# Patient Record
Sex: Female | Born: 1980 | Race: White | Hispanic: No | Marital: Single | Smoking: Current every day smoker
Health system: Southern US, Community
[De-identification: ages and names within clinical notes are randomized; demographics above are authoritative.]

## PROBLEM LIST (undated history)

## (undated) ENCOUNTER — Inpatient Hospital Stay (HOSPITAL_COMMUNITY): Payer: Self-pay

## (undated) DIAGNOSIS — Z789 Other specified health status: Secondary | ICD-10-CM

---
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
## 2003-05-27 ENCOUNTER — Emergency Department (HOSPITAL_COMMUNITY): Admission: EM | Admit: 2003-05-27 | Discharge: 2003-05-27 | Payer: Self-pay | Admitting: Emergency Medicine

## 2003-06-08 ENCOUNTER — Emergency Department (HOSPITAL_COMMUNITY): Admission: EM | Admit: 2003-06-08 | Discharge: 2003-06-08 | Payer: Self-pay | Admitting: Emergency Medicine

## 2004-02-08 ENCOUNTER — Emergency Department (HOSPITAL_COMMUNITY): Admission: EM | Admit: 2004-02-08 | Discharge: 2004-02-08 | Payer: Self-pay | Admitting: Emergency Medicine

## 2005-01-22 ENCOUNTER — Emergency Department (HOSPITAL_COMMUNITY): Admission: EM | Admit: 2005-01-22 | Discharge: 2005-01-22 | Payer: Self-pay | Admitting: Emergency Medicine

## 2005-02-01 ENCOUNTER — Emergency Department (HOSPITAL_COMMUNITY): Admission: EM | Admit: 2005-02-01 | Discharge: 2005-02-01 | Payer: Self-pay | Admitting: Emergency Medicine

## 2005-03-24 ENCOUNTER — Emergency Department (HOSPITAL_COMMUNITY): Admission: EM | Admit: 2005-03-24 | Discharge: 2005-03-24 | Payer: Self-pay | Admitting: Emergency Medicine

## 2005-09-01 ENCOUNTER — Emergency Department (HOSPITAL_COMMUNITY): Admission: EM | Admit: 2005-09-01 | Discharge: 2005-09-02 | Payer: Self-pay | Admitting: Emergency Medicine

## 2005-09-25 ENCOUNTER — Inpatient Hospital Stay (HOSPITAL_COMMUNITY): Admission: AD | Admit: 2005-09-25 | Discharge: 2005-09-25 | Payer: Self-pay | Admitting: Obstetrics and Gynecology

## 2006-01-07 ENCOUNTER — Ambulatory Visit: Payer: Self-pay | Admitting: Obstetrics and Gynecology

## 2006-01-07 ENCOUNTER — Inpatient Hospital Stay (HOSPITAL_COMMUNITY): Admission: AD | Admit: 2006-01-07 | Discharge: 2006-01-07 | Payer: Self-pay | Admitting: Gynecology

## 2006-01-09 ENCOUNTER — Ambulatory Visit: Payer: Self-pay | Admitting: Obstetrics & Gynecology

## 2006-03-05 ENCOUNTER — Inpatient Hospital Stay (HOSPITAL_COMMUNITY): Admission: AD | Admit: 2006-03-05 | Discharge: 2006-03-05 | Payer: Self-pay | Admitting: Family Medicine

## 2006-03-05 ENCOUNTER — Ambulatory Visit: Payer: Self-pay | Admitting: Obstetrics and Gynecology

## 2006-03-13 ENCOUNTER — Ambulatory Visit: Payer: Self-pay | Admitting: Obstetrics & Gynecology

## 2006-03-18 ENCOUNTER — Ambulatory Visit (HOSPITAL_COMMUNITY): Admission: RE | Admit: 2006-03-18 | Discharge: 2006-03-18 | Payer: Self-pay | Admitting: Family Medicine

## 2006-03-20 ENCOUNTER — Ambulatory Visit: Payer: Self-pay | Admitting: Obstetrics & Gynecology

## 2006-03-27 ENCOUNTER — Ambulatory Visit: Payer: Self-pay | Admitting: *Deleted

## 2006-04-05 ENCOUNTER — Ambulatory Visit: Payer: Self-pay | Admitting: Obstetrics & Gynecology

## 2006-04-08 ENCOUNTER — Ambulatory Visit: Payer: Self-pay | Admitting: *Deleted

## 2006-04-08 ENCOUNTER — Inpatient Hospital Stay (HOSPITAL_COMMUNITY): Admission: AD | Admit: 2006-04-08 | Discharge: 2006-04-10 | Payer: Self-pay | Admitting: Obstetrics & Gynecology

## 2006-05-21 HISTORY — PX: CHOLECYSTECTOMY: SHX55

## 2006-05-25 ENCOUNTER — Emergency Department (HOSPITAL_COMMUNITY): Admission: EM | Admit: 2006-05-25 | Discharge: 2006-05-25 | Payer: Self-pay | Admitting: Emergency Medicine

## 2007-05-13 IMAGING — US US OB COMP +14 WK
1 series · 13 of 28 positions shown · non-contrast
Comparison: none

CLINICAL DATA: 27 week 1 day assigned gestational age by prior ultrasound.  Evaluate fetal anatomy and growth.  Cramping.

[Series 1: us ob comp +14 wk · 0.29mm/px · 13 of 66 slices shown]
[im 3/66]
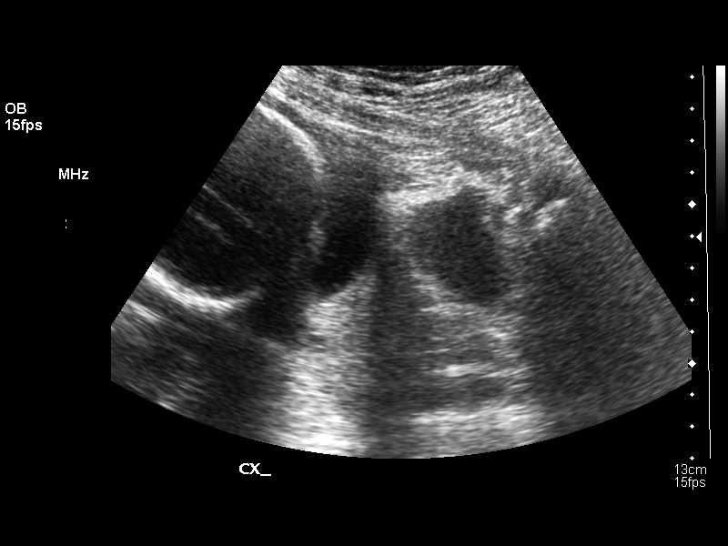
[im 8/66]
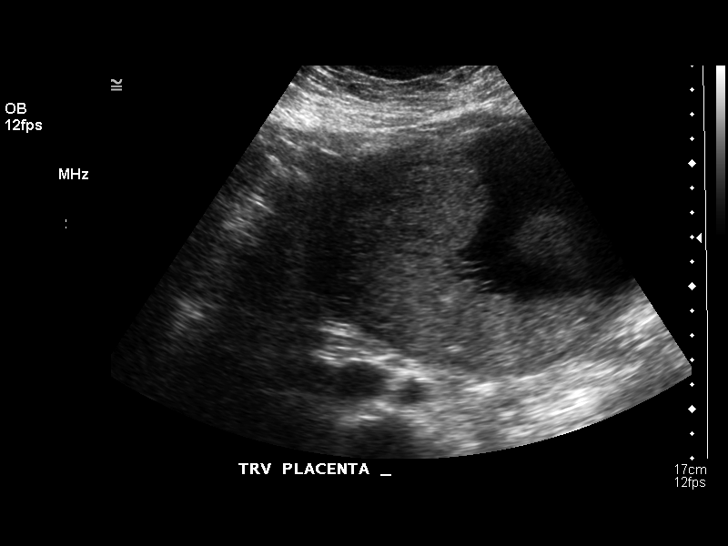
[im 13/66]
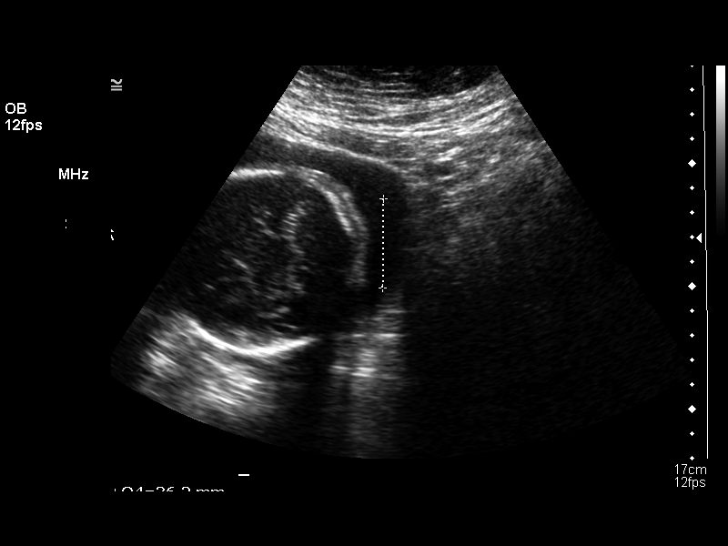
[im 17/66]
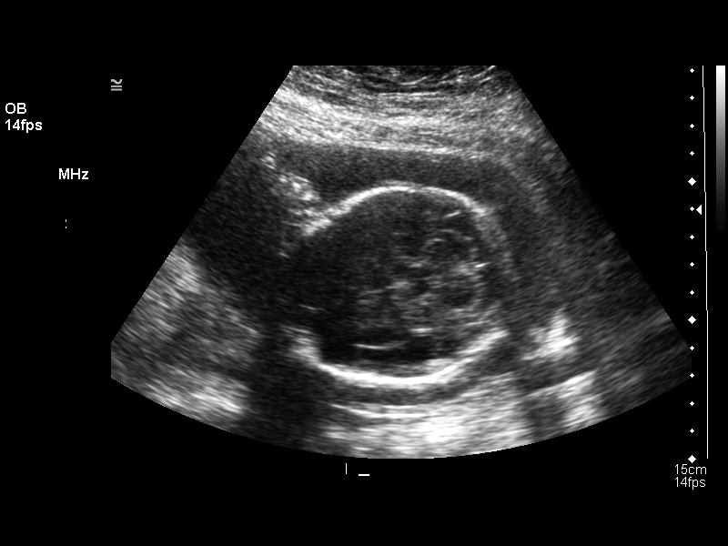
[im 22/66]
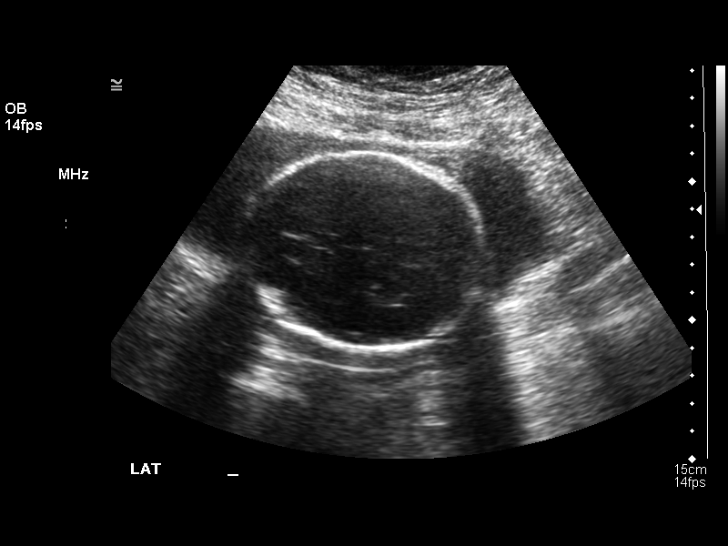
[im 27/66]
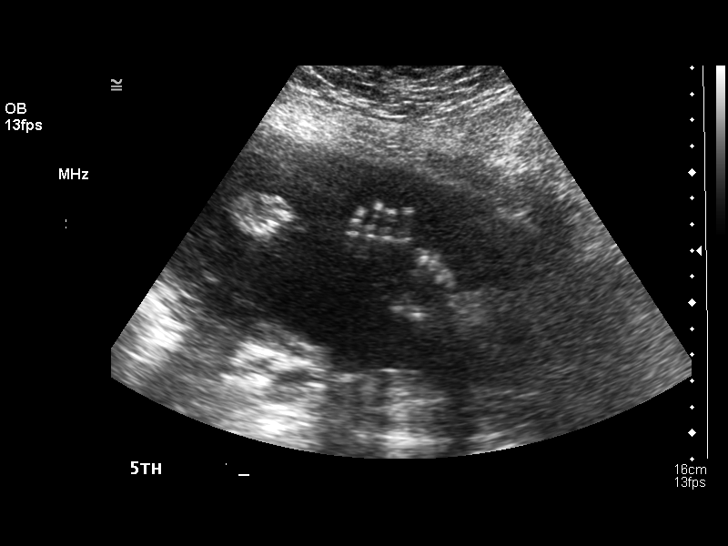
[im 34/66]
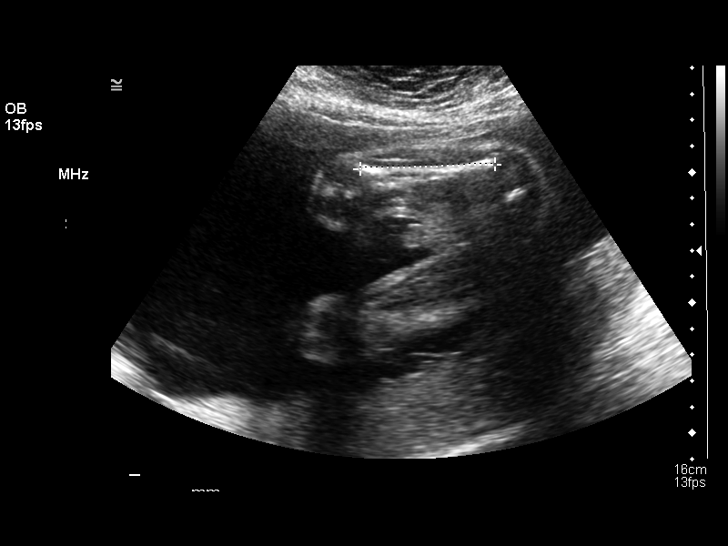
[im 39/66]
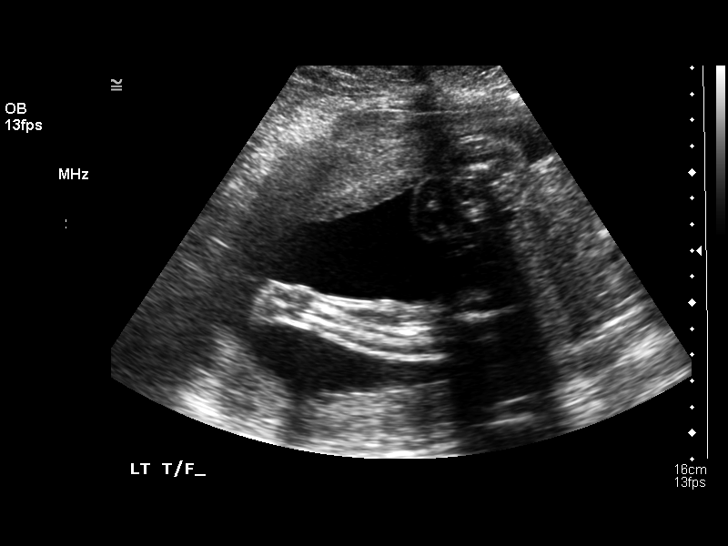
[im 44/66]
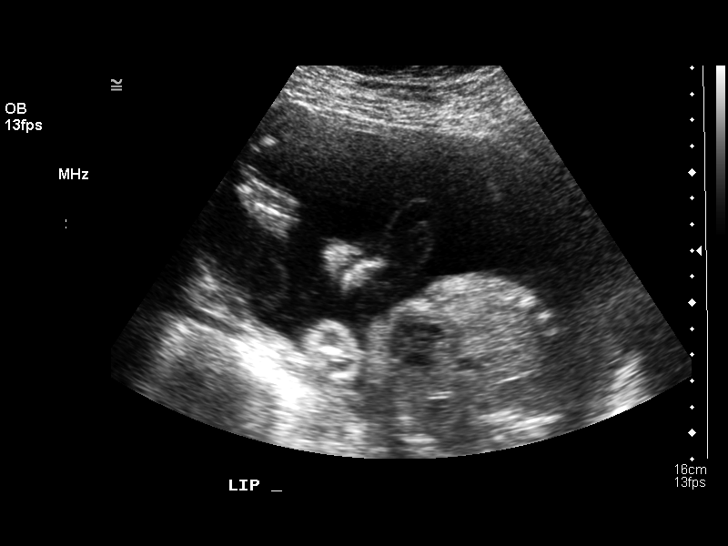
[im 49/66]
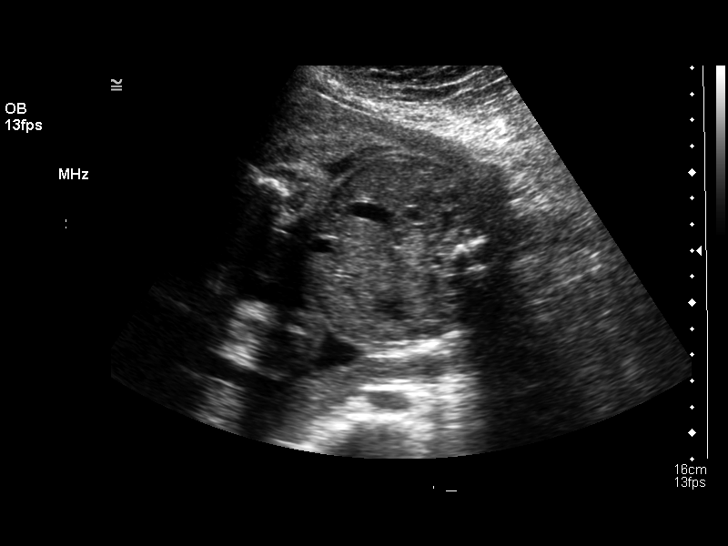
[im 53/66]
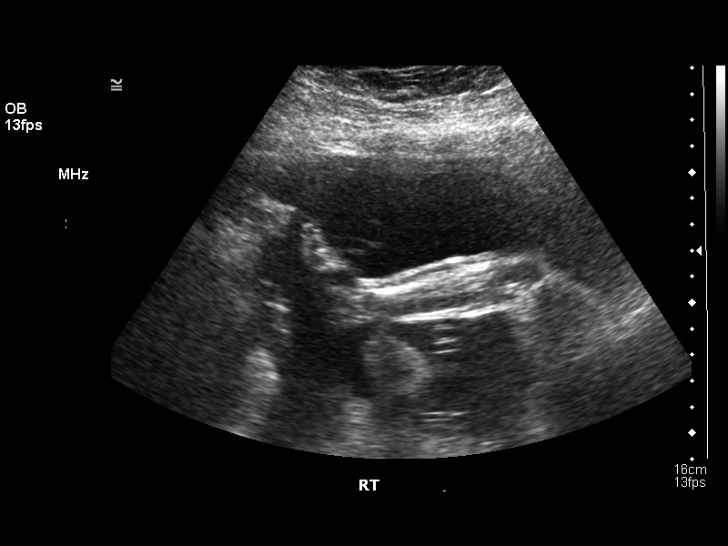
[im 58/66]
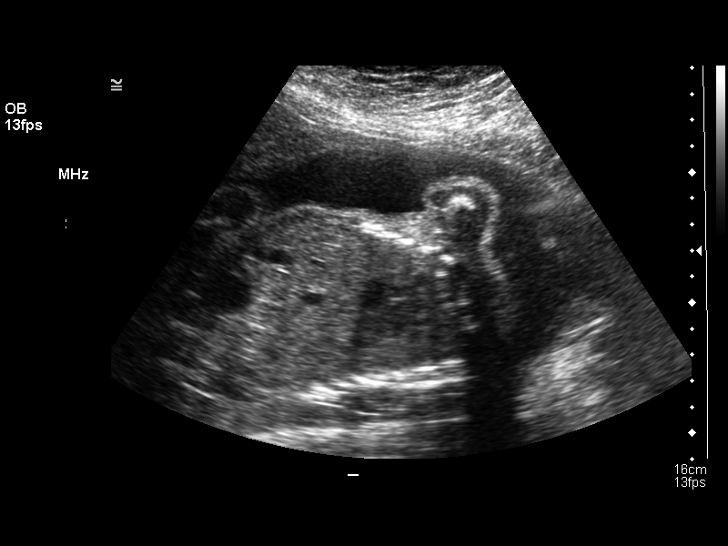
[im 63/66]
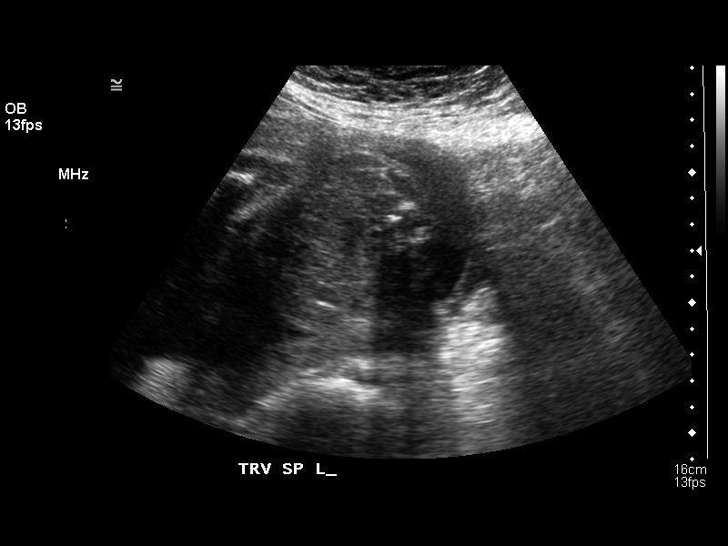

[13 of 28 positions shown; findings below may reference images not displayed]

OBSTETRICAL ULTRASOUND:
Number of Fetuses:  1
Heart Rate:  139 bpm
Movement:  Yes
Breathing:  No      
Presentation:  Cephalic
Placental Location:  Posterior
Grade:  I
Previa:   No
Amniotic Fluid (Subjective):  Normal
Amniotic Fluid (Objective):   AFI 14.5 cm (7th-17th %ile = 9.5 to 22.6 cm for 27 weeks) 

FETAL BIOMETRY
BPD:  7.0 cm  28 w 0 d
HC:  25.8 cm  28 w 0 d
AC:  24.7 cm  28 w 6 d
FL:  5.2 cm  21 w 5 d

MEAN GA:   28 w 1 d    US EDC:  03/31/06
Assigned GA:  27 w 1 d    Assigned EDC:    04/07/06

EFW: 9551   grams 90th - 95th %ile (2260 - 2169 g) for 27 weeks 

FETAL ANATOMY
Lateral Ventricles:  Visualized 
Thalami/CSP:  Visualized   
Posterior Fossa:  Visualized   
Nuchal Region:  Visualized 
Spine:  Not visualized   
4 Chamber Heart on Left:  Not visualized   
Stomach on Left:  Visualized   
3 Vessel Cord:  Visualized 
Cord Insertion site:  Visualized 
Kidneys:    Visualized   
Bladder:  Visualized   
Extremities:  Visualized   

ADDITIONAL ANATOMY VISUALIZED:  Upper lip, orbits, diaphragm, heel, and fifth digit.

Evaluation limited by:  Maternal habitus, fetal position, and advanced gestational age.

MATERNAL UTERINE AND ADNEXAL FINDINGS
Cervix:   3.5 cm transabdominally.
IMPRESSION: 1.  Assigned gestational age by prior ultrasound is currently 27 weeks 1 day.  Fetus is currently large for gestational age, with EFW at 90-95th percentile.  Further ultrasound follow-up of fetal growth is recommended to exclude macrosomia.
2.  Limited anatomic evaluation, as above.  isualized fetal anatomy is unremarkable.
3.  Normal amniotic fluid volume.  Normal cervical length.

## 2010-06-11 ENCOUNTER — Encounter: Payer: Self-pay | Admitting: *Deleted

## 2018-02-17 ENCOUNTER — Inpatient Hospital Stay (HOSPITAL_COMMUNITY): Payer: Medicaid Other

## 2018-02-17 ENCOUNTER — Inpatient Hospital Stay (HOSPITAL_COMMUNITY)
Admission: AD | Admit: 2018-02-17 | Discharge: 2018-02-17 | Disposition: A | Discharge status: Home / Self Care | Payer: Medicaid Other | Source: Ambulatory Visit | Attending: Obstetrics and Gynecology | Admitting: Obstetrics and Gynecology

## 2018-02-17 ENCOUNTER — Encounter (HOSPITAL_COMMUNITY): Payer: Self-pay

## 2018-02-17 DIAGNOSIS — F17210 Nicotine dependence, cigarettes, uncomplicated: Secondary | ICD-10-CM | POA: Insufficient documentation

## 2018-02-17 DIAGNOSIS — O468X1 Other antepartum hemorrhage, first trimester: Secondary | ICD-10-CM

## 2018-02-17 DIAGNOSIS — Z3A01 Less than 8 weeks gestation of pregnancy: Secondary | ICD-10-CM | POA: Insufficient documentation

## 2018-02-17 DIAGNOSIS — O208 Other hemorrhage in early pregnancy: Secondary | ICD-10-CM | POA: Diagnosis not present

## 2018-02-17 DIAGNOSIS — O23591 Infection of other part of genital tract in pregnancy, first trimester: Secondary | ICD-10-CM | POA: Insufficient documentation

## 2018-02-17 DIAGNOSIS — Z3490 Encounter for supervision of normal pregnancy, unspecified, unspecified trimester: Secondary | ICD-10-CM

## 2018-02-17 DIAGNOSIS — O99331 Smoking (tobacco) complicating pregnancy, first trimester: Secondary | ICD-10-CM | POA: Diagnosis not present

## 2018-02-17 DIAGNOSIS — N760 Acute vaginitis: Secondary | ICD-10-CM | POA: Diagnosis present

## 2018-02-17 DIAGNOSIS — R109 Unspecified abdominal pain: Secondary | ICD-10-CM | POA: Diagnosis present

## 2018-02-17 DIAGNOSIS — O418X10 Other specified disorders of amniotic fluid and membranes, first trimester, not applicable or unspecified: Secondary | ICD-10-CM | POA: Diagnosis present

## 2018-02-17 DIAGNOSIS — O209 Hemorrhage in early pregnancy, unspecified: Secondary | ICD-10-CM | POA: Diagnosis present

## 2018-02-17 DIAGNOSIS — B9689 Other specified bacterial agents as the cause of diseases classified elsewhere: Secondary | ICD-10-CM | POA: Insufficient documentation

## 2018-02-17 HISTORY — DX: Other specified health status: Z78.9

## 2018-02-17 LAB — CBC
HCT: 35.8 % — ABNORMAL LOW (ref 36.0–46.0)
Hemoglobin: 12.3 g/dL (ref 12.0–15.0)
MCH: 30.2 pg (ref 26.0–34.0)
MCHC: 34.4 g/dL (ref 30.0–36.0)
MCV: 88 fL (ref 78.0–100.0)
PLATELETS: 202 10*3/uL (ref 150–400)
RBC: 4.07 MIL/uL (ref 3.87–5.11)
RDW: 13.6 % (ref 11.5–15.5)
WBC: 10.8 10*3/uL — ABNORMAL HIGH (ref 4.0–10.5)

## 2018-02-17 LAB — WET PREP, GENITAL
SPERM: NONE SEEN
Trich, Wet Prep: NONE SEEN
YEAST WET PREP: NONE SEEN

## 2018-02-17 LAB — URINALYSIS, ROUTINE W REFLEX MICROSCOPIC
BILIRUBIN URINE: NEGATIVE
Bacteria, UA: NONE SEEN
Glucose, UA: NEGATIVE mg/dL
Ketones, ur: NEGATIVE mg/dL
LEUKOCYTES UA: NEGATIVE
NITRITE: NEGATIVE
Protein, ur: NEGATIVE mg/dL
Specific Gravity, Urine: 1.012 (ref 1.005–1.030)
pH: 6 (ref 5.0–8.0)

## 2018-02-17 LAB — POCT PREGNANCY, URINE: PREG TEST UR: POSITIVE — AB

## 2018-02-17 LAB — HCG, QUANTITATIVE, PREGNANCY: HCG, BETA CHAIN, QUANT, S: 177053 m[IU]/mL — AB (ref <5)

## 2018-02-17 MED ORDER — METRONIDAZOLE 500 MG PO TABS: 500.0000 mg | ORAL_TABLET | Freq: Two times a day (BID) | ORAL | 0 refills | Status: AC

## 2018-02-17 MED ORDER — COMPLETENATE 29-1 MG PO CHEW: 1.0000 | CHEWABLE_TABLET | Freq: Every day | ORAL | 1 refills | Status: AC

## 2018-02-17 MED ORDER — PROMETHAZINE HCL 12.5 MG PO TABS: 12.5000 mg | ORAL_TABLET | Freq: Four times a day (QID) | ORAL | 0 refills | Status: DC | PRN

## 2018-02-17 NOTE — MAU Provider Note (Addendum)
History     CSN: [id number]  Arrival date and time: 02/17/18 8657   First Provider Initiated Contact with Patient 02/17/18 2104      Chief Complaint  Patient presents with  . Vaginal Bleeding  . Abdominal Pain   HPI  Ms.  Victoria Briggs is a 37 y.o. year old [id number] female at [redacted]w[redacted]d weeks gestation who presents to MAU reporting (+) UPT on 01/31/2018, abdominal pain, and VB with stringy clots today @ 1830. She has not taken any medications for pain today.  Past Medical History:  Diagnosis Date  . Medical history non-contributory     Past Surgical History:  Procedure Laterality Date  . CHOLECYSTECTOMY  2008    History reviewed. No pertinent family history.  Social History   Tobacco Use  . Smoking status: Current Every Day Smoker    Packs/day: 0.50    Types: Cigarettes  . Smokeless tobacco: Never Used  Substance Use Topics  . Alcohol use: Not Currently    Frequency: Never  . Drug use: Never    Allergies:  Allergies  Allergen Reactions  . Latex Hives    Medications Prior to Admission  Medication Sig Dispense Refill Last Dose  . acetaminophen (TYLENOL) 325 MG tablet Take 650 mg by mouth every 6 (six) hours as needed.   02/17/2018 at 1000    Review of Systems  Constitutional: Negative.   HENT: Negative.   Eyes: Negative.   Respiratory: Negative.   Cardiovascular: Negative.   Gastrointestinal: Positive for abdominal pain.  Endocrine: Negative.   Genitourinary: Positive for vaginal bleeding.  Musculoskeletal: Negative.   Skin: Negative.   Allergic/Immunologic: Negative.   Neurological: Negative.   Hematological: Negative.   Psychiatric/Behavioral: Negative.    Physical Exam   Blood pressure 120/70, pulse 73, temperature 98.9 F (37.2 C), temperature source Oral, resp. rate 19, height 5\' 2"  (1.575 m), weight 95.5 kg, last menstrual period 01/09/2018.  Physical Exam  Nursing note and vitals reviewed. Constitutional: She is oriented to person, place, and  time. She appears well-developed and well-nourished.  HENT:  Head: Normocephalic and atraumatic.  Eyes: Pupils are equal, round, and reactive to light.  Neck: Normal range of motion.  Cardiovascular: Normal rate and regular rhythm.  Respiratory: Effort normal.  GI: Soft.  Genitourinary:  Genitourinary Comments: Uterus: enlarged, SE: cervix is smooth, pink, no lesions, small amt of dark, red blood in vagina -- WP, GC/CT done, closed/long/firm, no CMT or friability, no adnexal tenderness   Neurological: She is alert and oriented to person, place, and time.  Skin: Skin is warm and dry.  Psychiatric: She has a normal mood and affect. Her behavior is normal. Judgment and thought content normal.   MAU Course  Procedures  MDM CCUA UPT CBC ABO/Rh HCG Wet Prep GC/CT -- pending HIV -- pending OB < 14 wks [place] with TV  Results for orders placed or performed during the hospital encounter of 02/17/18 (from the past 24 hour(s))  Urinalysis, Routine w reflex microscopic     Status: Abnormal   Collection Time: 02/17/18  8:32 PM  Result Value Ref Range   Color, Urine STRAW (A) YELLOW   APPearance CLEAR CLEAR   Specific Gravity, Urine 1.012 1.005 - 1.030   pH 6.0 5.0 - 8.0   Glucose, UA NEGATIVE NEGATIVE mg/dL   Hgb urine dipstick LARGE (A) NEGATIVE   Bilirubin Urine NEGATIVE NEGATIVE   Ketones, ur NEGATIVE NEGATIVE mg/dL   Protein, ur NEGATIVE NEGATIVE mg/dL   Nitrite NEGATIVE  NEGATIVE   Leukocytes, UA NEGATIVE NEGATIVE   RBC / HPF 0-5 0 - 5 RBC/hpf   WBC, UA 0-5 0 - 5 WBC/hpf   Bacteria, UA NONE SEEN NONE SEEN   Squamous Epithelial / LPF 0-5 0 - 5   Mucus PRESENT   Pregnancy, urine POC     Status: Abnormal   Collection Time: 02/17/18  8:32 PM  Result Value Ref Range   Preg Test, Ur POSITIVE (A) NEGATIVE  Wet prep, genital     Status: Abnormal   Collection Time: 02/17/18  9:11 PM  Result Value Ref Range   Yeast Wet Prep HPF POC NONE SEEN NONE SEEN   Trich, Wet Prep NONE SEEN NONE  SEEN   Clue Cells Wet Prep HPF POC PRESENT (A) NONE SEEN   WBC, Wet Prep HPF POC FEW (A) NONE SEEN   Sperm NONE SEEN   CBC     Status: Abnormal   Collection Time: 02/17/18  9:21 PM  Result Value Ref Range   WBC 10.8 (H) 4.0 - 10.5 K/uL   RBC 4.07 3.87 - 5.11 MIL/uL   Hemoglobin 12.3 12.0 - 15.0 g/dL   HCT 09.8 (L) 11.9 - 14.7 %   MCV 88.0 78.0 - 100.0 fL   MCH 30.2 26.0 - 34.0 pg   MCHC 34.4 30.0 - 36.0 g/dL   RDW 82.9 56.2 - 13.0 %   Platelets 202 150 - 400 K/uL  ABO/Rh     Status: None (Preliminary result)   Collection Time: 02/17/18  9:21 PM  Result Value Ref Range   ABO/RH(D)      A POS Performed at [facility], [address] [place], [place] [address]   hCG, quantitative, pregnancy     Status: Abnormal   Collection Time: 02/17/18  9:21 PM  Result Value Ref Range   hCG, Beta Chain, Quant, S 177,053 (H) <5 mIU/mL    US Ob Less Than 14 Weeks With Ob Transvaginal  Result Date: 02/17/2018 CLINICAL DATA:  Bleeding and cramping EXAM: OBSTETRIC <14 WK [place] AND TRANSVAGINAL OB US TECHNIQUE: Both transabdominal and transvaginal ultrasound examinations were performed for complete evaluation of the gestation as well as the maternal uterus, adnexal regions, and pelvic cul-de-sac. Transvaginal technique was performed to assess early pregnancy. COMPARISON:  None. FINDINGS: Intrauterine gestational sac: Single intrauterine gestation Yolk sac:  Visible Embryo:  Visible Cardiac Activity: Visible Heart Rate: 162 bpm CRL: 16.2 mm   8 w   0 d                  [place] EDC: 09/29/2018 Subchorionic hemorrhage:  Small subchorionic hemorrhage Maternal uterus/adnexae: No significant free fluid. Ovaries are within normal limits. Right ovary measures 3 x 1.5 by 2.2 cm. The left ovary measures 2.8 x 4 x 2.5 cm. Corpus luteal cyst in left ovary. IMPRESSION: Single viable intrauterine pregnancy as above. Small subchorionic hemorrhage. Electronically Signed   By: Jasmine Pang M.D.   On: 02/17/2018 22:40      Assessment and Plan  Subchorionic hematoma in first trimester, single or unspecified fetus  - Information provided on [facility]  - Advised that brownish red bleeding may occur; discussed to return to MAU if bleeding increases to soaking pads  Bleeding in early pregnancy  - Bleeding precautions reviewed - S/Sx's to return to MAU for discussed - Information provided on VB in pregnancy   BV (bacterial vaginosis) - Rx for Flagyl 500 mg BID x 7 days - Information provided  on BV  - Discharge home - Start prenatal care ASAP - Patient verbalized an understanding of the plan of care and agrees.     Raelyn Mora, MSN, CNM 02/17/2018, 9:12 PM

## 2018-02-17 NOTE — Discharge Instructions (Signed)
[facility] Prenatal Care Providers °         °[facility]  ° Phone: [phone] ° °[facility] @ [facility]  ° Phone: [phone] ° °[facility]      ° Phone: [phone]   °         °[facility] @ [place]    ° Phone: [phone] °         °[facility] @ [place]  ° Phone: [phone] ° °[facility] @ [facility] ° Phone: [phone] °    °[facility] ([place]) ° Phone: [phone] °Safe Medications in Pregnancy  ° °Acne: °Benzoyl Peroxide °Salicylic Acid ° °Backache/Headache: °Tylenol: 2 regular strength every 4 hours OR °             2 Extra strength every 6 hours ° °Colds/Coughs/Allergies: °Benadryl (alcohol free) 25 mg every 6 hours as needed °Breath right strips °Claritin °Cepacol throat lozenges °Chloraseptic throat spray °Cold-Eeze- up to three times per day °Cough drops, alcohol free °Flonase (by prescription only) °Guaifenesin °Mucinex °Robitussin DM (plain only, alcohol free) °Saline nasal spray/drops °Sudafed (pseudoephedrine) & Actifed ** use only after [redacted] weeks gestation and if you do not have high blood pressure °Tylenol °Vicks Vaporub °Zinc lozenges °Zyrtec  ° °Constipation: °Colace °Ducolax suppositories °Fleet enema °Glycerin suppositories °Metamucil °Milk of magnesia °Miralax °Senokot °Smooth move tea ° °Diarrhea: °Kaopectate °Imodium A-D ° °*NO pepto Bismol ° °Hemorrhoids: °Anusol °Anusol HC °Preparation H °Tucks ° °Indigestion: °Tums °Maalox °Mylanta °Zantac  °Pepcid ° °Insomnia: °Benadryl (alcohol free) 25mg every 6 hours as needed °Tylenol PM °Unisom, no Gelcaps ° °Leg Cramps: °Tums °MagGel ° °Nausea/Vomiting:  °Bonine °Dramamine °Emetrol °Ginger extract °Sea bands °Meclizine  °Nausea medication to take during pregnancy:  °Unisom (doxylamine succinate 25 mg tablets) Take one tablet daily at bedtime. If symptoms are not adequately controlled, the dose  can be increased to a maximum recommended dose of two tablets daily (1/2 tablet in the morning, 1/2 tablet mid-afternoon and one at bedtime). °Vitamin B6 100mg tablets. Take one tablet twice a day (up to 200 mg per day). ° °Skin Rashes: °Aveeno products °Benadryl cream or 25mg every 6 hours as needed °Calamine Lotion °1% cortisone cream ° °Yeast infection: °Gyne-lotrimin 7 °Monistat 7 ° ° °**If taking multiple medications, please check labels to avoid duplicating the same active ingredients °**take medication as directed on the label °** Do not exceed 4000 mg of tylenol in 24 hours °**Do not take medications that contain aspirin or ibuprofen ° ° ° ° °

## 2018-02-17 NOTE — MAU Note (Signed)
Had a positive UPT 01/31/2018.  Started having bleeding with stringy clots and cramping around 1830 tonight.  LMP 8/12.  Has not taken anything for the cramping.

## 2018-02-18 LAB — HIV ANTIBODY (ROUTINE TESTING W REFLEX): HIV SCREEN 4TH GENERATION: NONREACTIVE

## 2018-02-18 LAB — GC/CHLAMYDIA PROBE AMP (~~LOC~~) NOT AT ARMC
Chlamydia: NEGATIVE
NEISSERIA GONORRHEA: NEGATIVE

## 2018-02-18 LAB — ABO/RH: ABO/RH(D): A POS

## 2018-04-10 LAB — OB RESULTS CONSOLE ABO/RH: RH Type: POSITIVE

## 2018-04-10 LAB — OB RESULTS CONSOLE ANTIBODY SCREEN: Antibody Screen: NEGATIVE

## 2018-04-10 LAB — OB RESULTS CONSOLE RUBELLA ANTIBODY, IGM: Rubella: NON-IMMUNE/NOT IMMUNE

## 2018-04-10 LAB — OB RESULTS CONSOLE RPR: RPR: NONREACTIVE

## 2018-04-10 LAB — OB RESULTS CONSOLE PLATELET COUNT: Platelets: 224

## 2018-04-10 LAB — OB RESULTS CONSOLE HGB/HCT, BLOOD
HCT: 36 (ref 29–41)
Hemoglobin: 12.2

## 2018-04-10 LAB — OB RESULTS CONSOLE GC/CHLAMYDIA
Chlamydia: NEGATIVE
Gonorrhea: POSITIVE

## 2018-04-10 LAB — OB RESULTS CONSOLE HEPATITIS B SURFACE ANTIGEN: Hepatitis B Surface Ag: NEGATIVE

## 2018-04-10 LAB — OB RESULTS CONSOLE VARICELLA ZOSTER ANTIBODY, IGG: Varicella: NON-IMMUNE/NOT IMMUNE

## 2018-04-25 DIAGNOSIS — O09522 Supervision of elderly multigravida, second trimester: Secondary | ICD-10-CM | POA: Insufficient documentation

## 2018-05-21 NOTE — L&D Delivery Note (Addendum)
Delivery Note RN requested AROM for FSE placement due to difficulty tracing FHR. Moderate MSF. Pt feeling urge to push. Few deep variables. Pt pushed well and at 2:35 PM a viable and healthy female was delivered via Vaginal, Spontaneous (Presentation: OA).  APGAR: 7, 9; weight  pending.   Placenta status: spontaneous, intact.  Cord: 3VC with the following complications: None.  Cord pH: NA  Baby to warmer due to dusky episode. O2 Sat, HR, tone Nml.   Anesthesia: Epidural Episiotomy: None Lacerations: None Suture Repair: NA Est. Blood Loss (mL): 350 ml   Mom to postpartum.  Baby to Couplet care / Skin to Skin. Pt has not decided of she wants baby to room in or stay in nursery since she is planning adoption.  SW consult.  Will monitor BP's and determine if pt needs PP Mag Sulfate.  Dorathy Kinsman 09/21/2018, 3:39 PM

## 2018-07-17 LAB — GLUCOSE, 1 HOUR: GLUCOSE 1 HOUR: 132

## 2018-07-17 LAB — OB RESULTS CONSOLE PLATELET COUNT: Platelets: 304

## 2018-07-17 LAB — OB RESULTS CONSOLE HGB/HCT, BLOOD
HCT: 35 (ref 29–41)
Hemoglobin: 12.1

## 2018-07-17 LAB — HIV ANTIBODY (ROUTINE TESTING W REFLEX): HIV Screen 4th Generation wRfx: NONREACTIVE

## 2018-08-15 ENCOUNTER — Telehealth: Payer: Self-pay | Admitting: Lactation Services

## 2018-08-15 NOTE — Telephone Encounter (Signed)
Dorene Grebe from [facility] called to see if referral has been obtained and if appointment has been scheduled.   Informed Dorene Grebe that an appointment has been scheduled on 4/15 @ 2:55 with Dr. Debroah Loop.   Natalie relayed conversation between pt and myself.   Pt voiced concern that she needs a BTL Consent signed at least 30 days prior to delivery. Delivery is to be around 5/13, which would not be 30 days. Discussed Pt will need to see the provider to be able to obtain informed consent.   Pt to call back on Monday to see if there are any earlier appts available.

## 2018-08-26 ENCOUNTER — Encounter: Payer: Self-pay | Admitting: *Deleted

## 2018-08-26 LAB — CYTOLOGY - PAP
Glucose 1 Hour: 88
PAP SMEAR: NEGATIVE

## 2018-09-03 ENCOUNTER — Encounter: Payer: Self-pay | Admitting: Obstetrics & Gynecology

## 2018-09-18 ENCOUNTER — Encounter: Payer: Self-pay | Admitting: *Deleted

## 2018-09-21 ENCOUNTER — Encounter (HOSPITAL_COMMUNITY): Payer: Self-pay | Admitting: Advanced Practice Midwife

## 2018-09-21 ENCOUNTER — Inpatient Hospital Stay (HOSPITAL_COMMUNITY)
Admission: AD | Admit: 2018-09-21 | Discharge: 2018-09-23 | DRG: 807 | Disposition: A | Discharge status: Home / Self Care | Payer: Medicaid Other | Attending: Obstetrics and Gynecology | Admitting: Obstetrics and Gynecology

## 2018-09-21 ENCOUNTER — Other Ambulatory Visit: Payer: Self-pay

## 2018-09-21 ENCOUNTER — Inpatient Hospital Stay (HOSPITAL_COMMUNITY): Payer: Medicaid Other | Admitting: Anesthesiology

## 2018-09-21 DIAGNOSIS — F17210 Nicotine dependence, cigarettes, uncomplicated: Secondary | ICD-10-CM | POA: Diagnosis present

## 2018-09-21 DIAGNOSIS — O99334 Smoking (tobacco) complicating childbirth: Secondary | ICD-10-CM | POA: Diagnosis present

## 2018-09-21 DIAGNOSIS — O133 Gestational [pregnancy-induced] hypertension without significant proteinuria, third trimester: Secondary | ICD-10-CM

## 2018-09-21 DIAGNOSIS — O468X1 Other antepartum hemorrhage, first trimester: Secondary | ICD-10-CM

## 2018-09-21 DIAGNOSIS — O418X10 Other specified disorders of amniotic fluid and membranes, first trimester, not applicable or unspecified: Secondary | ICD-10-CM

## 2018-09-21 DIAGNOSIS — Z3A38 38 weeks gestation of pregnancy: Secondary | ICD-10-CM

## 2018-09-21 DIAGNOSIS — B9689 Other specified bacterial agents as the cause of diseases classified elsewhere: Secondary | ICD-10-CM

## 2018-09-21 DIAGNOSIS — N760 Acute vaginitis: Secondary | ICD-10-CM

## 2018-09-21 DIAGNOSIS — O134 Gestational [pregnancy-induced] hypertension without significant proteinuria, complicating childbirth: Secondary | ICD-10-CM | POA: Diagnosis present

## 2018-09-21 DIAGNOSIS — O99324 Drug use complicating childbirth: Secondary | ICD-10-CM

## 2018-09-21 DIAGNOSIS — Z3490 Encounter for supervision of normal pregnancy, unspecified, unspecified trimester: Secondary | ICD-10-CM

## 2018-09-21 DIAGNOSIS — O209 Hemorrhage in early pregnancy, unspecified: Secondary | ICD-10-CM

## 2018-09-21 DIAGNOSIS — O26893 Other specified pregnancy related conditions, third trimester: Secondary | ICD-10-CM | POA: Diagnosis present

## 2018-09-21 LAB — CBC
HCT: 37.5 % (ref 36.0–46.0)
Hemoglobin: 12.9 g/dL (ref 12.0–15.0)
MCH: 29.1 pg (ref 26.0–34.0)
MCHC: 34.4 g/dL (ref 30.0–36.0)
MCV: 84.5 fL (ref 80.0–100.0)
Platelets: 270 10*3/uL (ref 150–400)
RBC: 4.44 MIL/uL (ref 3.87–5.11)
RDW: 14.3 % (ref 11.5–15.5)
WBC: 13.2 10*3/uL — ABNORMAL HIGH (ref 4.0–10.5)
nRBC: 0 % (ref 0.0–0.2)

## 2018-09-21 LAB — RAPID URINE DRUG SCREEN, HOSP PERFORMED
Amphetamines: NOT DETECTED
Barbiturates: NOT DETECTED
Benzodiazepines: NOT DETECTED
Cocaine: NOT DETECTED
Opiates: NOT DETECTED
Tetrahydrocannabinol: POSITIVE — AB

## 2018-09-21 LAB — URINALYSIS, ROUTINE W REFLEX MICROSCOPIC
Bilirubin Urine: NEGATIVE
Glucose, UA: NEGATIVE mg/dL
Hgb urine dipstick: NEGATIVE
Ketones, ur: 20 mg/dL — AB
Leukocytes,Ua: NEGATIVE
Nitrite: NEGATIVE
Protein, ur: 30 mg/dL — AB
Specific Gravity, Urine: 1.02 (ref 1.005–1.030)
pH: 5 (ref 5.0–8.0)

## 2018-09-21 LAB — COMPREHENSIVE METABOLIC PANEL
ALT: 12 U/L (ref 0–44)
AST: 13 U/L — ABNORMAL LOW (ref 15–41)
Albumin: 2.2 g/dL — ABNORMAL LOW (ref 3.5–5.0)
Alkaline Phosphatase: 153 U/L — ABNORMAL HIGH (ref 38–126)
Anion gap: 12 (ref 5–15)
BUN: 9 mg/dL (ref 6–20)
CO2: 19 mmol/L — ABNORMAL LOW (ref 22–32)
Calcium: 8.8 mg/dL — ABNORMAL LOW (ref 8.9–10.3)
Chloride: 104 mmol/L (ref 98–111)
Creatinine, Ser: 0.71 mg/dL (ref 0.44–1.00)
GFR calc Af Amer: >60 mL/min (ref >60)
GFR calc non Af Amer: >60 mL/min (ref >60)
Glucose, Bld: 88 mg/dL (ref 70–99)
Potassium: 4.1 mmol/L (ref 3.5–5.1)
Sodium: 135 mmol/L (ref 135–145)
Total Bilirubin: 0.2 mg/dL — ABNORMAL LOW (ref 0.3–1.2)
Total Protein: 6.1 g/dL — ABNORMAL LOW (ref 6.5–8.1)

## 2018-09-21 LAB — PROTEIN / CREATININE RATIO, URINE
Creatinine, Urine: 155.86 mg/dL
Protein Creatinine Ratio: 0.19 mg/mg{Cre} — ABNORMAL HIGH (ref 0.00–0.15)
Total Protein, Urine: 30 mg/dL

## 2018-09-21 LAB — GROUP B STREP BY PCR: Group B strep by PCR: NEGATIVE

## 2018-09-21 LAB — ABO/RH: ABO/RH(D): A POS

## 2018-09-21 LAB — TYPE AND SCREEN
ABO/RH(D): A POS
Antibody Screen: NEGATIVE

## 2018-09-21 MED ORDER — IBUPROFEN 600 MG PO TABS
600.0000 mg | ORAL_TABLET | Freq: Four times a day (QID) | ORAL | Status: DC
  Administered 2018-09-21 – 2018-09-23 (×7): 600 mg via ORAL
  Filled 2018-09-21 (×7): qty 1

## 2018-09-21 MED ORDER — OXYCODONE-ACETAMINOPHEN 5-325 MG PO TABS: 2.0000 | ORAL_TABLET | ORAL | Status: DC | PRN

## 2018-09-21 MED ORDER — SIMETHICONE 80 MG PO CHEW: 80.0000 mg | CHEWABLE_TABLET | ORAL | Status: DC | PRN

## 2018-09-21 MED ORDER — FENTANYL-BUPIVACAINE-NACL 0.5-0.125-0.9 MG/250ML-% EP SOLN
12.0000 mL/h | EPIDURAL | Status: DC | PRN
  Filled 2018-09-21: qty 250

## 2018-09-21 MED ORDER — DIPHENHYDRAMINE HCL 25 MG PO CAPS
25.0000 mg | ORAL_CAPSULE | Freq: Four times a day (QID) | ORAL | Status: DC | PRN
  Administered 2018-09-21: 25 mg via ORAL
  Filled 2018-09-21: qty 1

## 2018-09-21 MED ORDER — ONDANSETRON HCL 4 MG/2ML IJ SOLN: 4.0000 mg | INTRAMUSCULAR | Status: DC | PRN

## 2018-09-21 MED ORDER — ACETAMINOPHEN 325 MG PO TABS: 650.0000 mg | ORAL_TABLET | ORAL | Status: DC | PRN

## 2018-09-21 MED ORDER — ONDANSETRON HCL 4 MG/2ML IJ SOLN
4.0000 mg | Freq: Four times a day (QID) | INTRAMUSCULAR | Status: DC | PRN
  Administered 2018-09-21: 4 mg via INTRAVENOUS
  Filled 2018-09-21: qty 2

## 2018-09-21 MED ORDER — EPHEDRINE 5 MG/ML INJ
10.0000 mg | INTRAVENOUS | Status: DC | PRN
  Filled 2018-09-21: qty 2

## 2018-09-21 MED ORDER — DIBUCAINE (PERIANAL) 1 % EX OINT: 1.0000 "application " | TOPICAL_OINTMENT | CUTANEOUS | Status: DC | PRN

## 2018-09-21 MED ORDER — MEASLES, MUMPS & RUBELLA VAC IJ SOLR
0.5000 mL | Freq: Once | INTRAMUSCULAR | Status: AC
  Administered 2018-09-22: 16:00:00 0.5 mL via SUBCUTANEOUS
  Filled 2018-09-21: qty 0.5

## 2018-09-21 MED ORDER — HYDRALAZINE HCL 20 MG/ML IJ SOLN: 10.0000 mg | INTRAMUSCULAR | Status: DC | PRN

## 2018-09-21 MED ORDER — TETANUS-DIPHTH-ACELL PERTUSSIS 5-2.5-18.5 LF-MCG/0.5 IM SUSP: 0.5000 mL | Freq: Once | INTRAMUSCULAR | Status: DC

## 2018-09-21 MED ORDER — LABETALOL HCL 5 MG/ML IV SOLN: 20.0000 mg | INTRAVENOUS | Status: DC | PRN

## 2018-09-21 MED ORDER — LABETALOL HCL 5 MG/ML IV SOLN: 80.0000 mg | INTRAVENOUS | Status: DC | PRN

## 2018-09-21 MED ORDER — FLEET ENEMA 7-19 GM/118ML RE ENEM: 1.0000 | ENEMA | RECTAL | Status: DC | PRN

## 2018-09-21 MED ORDER — FERROUS SULFATE 325 (65 FE) MG PO TABS
325.0000 mg | ORAL_TABLET | Freq: Two times a day (BID) | ORAL | Status: DC
  Administered 2018-09-21 – 2018-09-23 (×4): 325 mg via ORAL
  Filled 2018-09-21 (×4): qty 1

## 2018-09-21 MED ORDER — ONDANSETRON HCL 4 MG/2ML IJ SOLN
4.0000 mg | Freq: Once | INTRAMUSCULAR | Status: AC
  Administered 2018-09-21: 4 mg via INTRAVENOUS
  Filled 2018-09-21: qty 2

## 2018-09-21 MED ORDER — OXYTOCIN 40 UNITS IN NORMAL SALINE INFUSION - SIMPLE MED
2.5000 [IU]/h | INTRAVENOUS | Status: DC
  Filled 2018-09-21: qty 1000

## 2018-09-21 MED ORDER — ZOLPIDEM TARTRATE 5 MG PO TABS: 5.0000 mg | ORAL_TABLET | Freq: Every evening | ORAL | Status: DC | PRN

## 2018-09-21 MED ORDER — DIPHENHYDRAMINE HCL 50 MG/ML IJ SOLN: 12.5000 mg | INTRAMUSCULAR | Status: DC | PRN

## 2018-09-21 MED ORDER — PHENYLEPHRINE 40 MCG/ML (10ML) SYRINGE FOR IV PUSH (FOR BLOOD PRESSURE SUPPORT)
80.0000 ug | PREFILLED_SYRINGE | INTRAVENOUS | Status: DC | PRN
  Filled 2018-09-21: qty 10

## 2018-09-21 MED ORDER — LACTATED RINGERS IV BOLUS
1000.0000 mL | Freq: Once | INTRAVENOUS | Status: AC
  Administered 2018-09-21: 12:00:00 1000 mL via INTRAVENOUS

## 2018-09-21 MED ORDER — COCONUT OIL OIL: 1.0000 "application " | TOPICAL_OIL | Status: DC | PRN

## 2018-09-21 MED ORDER — SODIUM CHLORIDE (PF) 0.9 % IJ SOLN
INTRAMUSCULAR | Status: DC | PRN
  Administered 2018-09-21: 14 mL/h via EPIDURAL

## 2018-09-21 MED ORDER — PRENATAL MULTIVITAMIN CH
1.0000 | ORAL_TABLET | Freq: Every day | ORAL | Status: DC
  Administered 2018-09-22: 12:00:00 1 via ORAL
  Filled 2018-09-21: qty 1

## 2018-09-21 MED ORDER — OXYCODONE-ACETAMINOPHEN 5-325 MG PO TABS: 1.0000 | ORAL_TABLET | ORAL | Status: DC | PRN

## 2018-09-21 MED ORDER — WITCH HAZEL-GLYCERIN EX PADS: 1.0000 "application " | MEDICATED_PAD | CUTANEOUS | Status: DC | PRN

## 2018-09-21 MED ORDER — FENTANYL CITRATE (PF) 100 MCG/2ML IJ SOLN
100.0000 ug | INTRAMUSCULAR | Status: DC | PRN
  Administered 2018-09-21 (×2): 100 ug via INTRAVENOUS
  Filled 2018-09-21 (×2): qty 2

## 2018-09-21 MED ORDER — LACTATED RINGERS IV SOLN: 500.0000 mL | INTRAVENOUS | Status: DC | PRN

## 2018-09-21 MED ORDER — SOD CITRATE-CITRIC ACID 500-334 MG/5ML PO SOLN: 30.0000 mL | ORAL | Status: DC | PRN

## 2018-09-21 MED ORDER — ONDANSETRON HCL 4 MG PO TABS: 4.0000 mg | ORAL_TABLET | ORAL | Status: DC | PRN

## 2018-09-21 MED ORDER — PHENYLEPHRINE 40 MCG/ML (10ML) SYRINGE FOR IV PUSH (FOR BLOOD PRESSURE SUPPORT)
80.0000 ug | PREFILLED_SYRINGE | INTRAVENOUS | Status: DC | PRN
  Filled 2018-09-21 (×2): qty 10

## 2018-09-21 MED ORDER — OXYTOCIN BOLUS FROM INFUSION
500.0000 mL | Freq: Once | INTRAVENOUS | Status: AC
  Administered 2018-09-21: 15:00:00 500 mL via INTRAVENOUS

## 2018-09-21 MED ORDER — LABETALOL HCL 5 MG/ML IV SOLN: 40.0000 mg | INTRAVENOUS | Status: DC | PRN

## 2018-09-21 MED ORDER — MAGNESIUM HYDROXIDE 400 MG/5ML PO SUSP: 30.0000 mL | ORAL | Status: DC | PRN

## 2018-09-21 MED ORDER — LIDOCAINE-EPINEPHRINE (PF) 2 %-1:200000 IJ SOLN
INTRAMUSCULAR | Status: DC | PRN
  Administered 2018-09-21: 3 mL via EPIDURAL
  Administered 2018-09-21: 2 mL via EPIDURAL

## 2018-09-21 MED ORDER — LACTATED RINGERS IV SOLN
500.0000 mL | Freq: Once | INTRAVENOUS | Status: AC
  Administered 2018-09-21: 14:00:00 500 mL via INTRAVENOUS

## 2018-09-21 MED ORDER — LIDOCAINE HCL (PF) 1 % IJ SOLN: 30.0000 mL | INTRAMUSCULAR | Status: DC | PRN

## 2018-09-21 MED ORDER — LACTATED RINGERS IV SOLN: INTRAVENOUS | Status: DC

## 2018-09-21 MED ORDER — BENZOCAINE-MENTHOL 20-0.5 % EX AERO
1.0000 "application " | INHALATION_SPRAY | CUTANEOUS | Status: DC | PRN
  Administered 2018-09-21: 1 via TOPICAL
  Filled 2018-09-21: qty 56

## 2018-09-21 NOTE — Anesthesia Preprocedure Evaluation (Signed)
Anesthesia Evaluation  Patient identified by MRN, date of birth, ID band Patient awake    Reviewed: Allergy & Precautions, NPO status , Patient's Chart, lab work & pertinent test results  Airway Mallampati: II  TM Distance: >3 FB Neck ROM: Full    Dental no notable dental hx.    Pulmonary neg pulmonary ROS, Current Smoker,    Pulmonary exam normal breath sounds clear to auscultation       Cardiovascular negative cardio ROS Normal cardiovascular exam Rhythm:Regular Rate:Normal     Neuro/Psych negative neurological ROS  negative psych ROS   GI/Hepatic negative GI ROS, Neg liver ROS,   Endo/Other  negative endocrine ROS  Renal/GU negative Renal ROS  negative genitourinary   Musculoskeletal negative musculoskeletal ROS (+)   Abdominal   Peds  Hematology negative hematology ROS (+)   Anesthesia Other Findings   Reproductive/Obstetrics (+) Pregnancy                             Anesthesia Physical Anesthesia Plan  ASA: II  Anesthesia Plan: Epidural   Post-op Pain Management:    Induction:   PONV Risk Score and Plan: Treatment may vary due to age or medical condition  Airway Management Planned: Natural Airway  Additional Equipment:   Intra-op Plan:   Post-operative Plan:   Informed Consent: I have reviewed the patients History and Physical, chart, labs and discussed the procedure including the risks, benefits and alternatives for the proposed anesthesia with the patient or authorized representative who has indicated his/her understanding and acceptance.       Plan Discussed with: Anesthesiologist  Anesthesia Plan Comments: (Patient identified. Risks, benefits, options discussed with patient including but not limited to bleeding, infection, nerve damage, paralysis, failed block, incomplete pain control, headache, blood pressure changes, nausea, vomiting, reactions to medication,  itching, and post partum back pain. Confirmed with bedside nurse the patient's most recent platelet count. Confirmed with the patient that they are not taking any anticoagulation, have any bleeding history or any family history of bleeding disorders. Patient expressed understanding and wishes to proceed. All questions were answered. )        Anesthesia Quick Evaluation

## 2018-09-21 NOTE — Discharge Summary (Addendum)
Postpartum Discharge Summary     Patient Name: Victoria Briggs DOB: 08/18/1980 MRN: [id number]  Date of admission: 09/21/2018 Delivering Provider:     Date of discharge: 09/23/18 Admitting diagnosis: CTX;39 weeks Intrauterine pregnancy: 492w4d     Secondary diagnosis:  Active Problems:   Labor and delivery indication for care or intervention  Additional problems: Gestational hypertension     Discharge diagnosis: Term Pregnancy Delivered and Gestational Hypertension                                                                                                Post partum procedure: none  Augmentation: None  Complications: None  Hospital course:  Onset of Labor With Vaginal Delivery     38 y.o. yo [id number] at [redacted]w[redacted]d was admitted in Active Labor on 09/21/2018. Had elevated BP's, few severe-range in transition. Patient had an uncomplicated labor course as follows:  Membrane Rupture Time/Date: 2:22 PM ,09/21/2018   Intrapartum Procedures: Episiotomy:                                           Lacerations:     Patient had a delivery of a Viable infant. 09/21/2018  Information for the patient's newborn:  Victoria Briggs, Girl Victoria Briggs [[id number]][[id number]]       Pateint had an uncomplicated postpartum course.  She is ambulating, tolerating a regular diet, passing flatus, and urinating well. Patient is discharged home in stable condition on 09/21/18.   Magnesium Sulfate recieved: no BMZ received: No  Physical exam  Vitals:   09/21/18 1401 09/21/18 1405 09/21/18 1445 09/21/18 1500  BP: (!) 141/82 (!) 152/122 (!) 141/83 140/88  Pulse: (!) 107 (!) 103 88 85  Resp: (!) 24 18 16 16   Temp:    (!) 96.1 F (35.6 C)  TempSrc:    Axillary  SpO2:  96% 97%    General: alert Lochia: appropriate Uterine Fundus: firm Incision: N/A DVT Evaluation: No evidence of DVT seen on physical exam. Labs: Lab Results  Component Value Date   WBC 13.2 (H) 09/21/2018   HGB 12.9 09/21/2018   HCT 37.5 09/21/2018   MCV 84.5  09/21/2018   PLT 270 09/21/2018   CMP Latest Ref Rng & Units 09/21/2018  Glucose 70 - 99 mg/dL 88  BUN 6 - 20 mg/dL 9  Creatinine 1.300.44 - 8.651.00 mg/dL 7.840.71  Sodium 696135 - 295145 mmol/L 135  Potassium 3.5 - 5.1 mmol/L 4.1  Chloride 98 - 111 mmol/L 104  CO2 22 - 32 mmol/L 19(L)  Calcium 8.9 - 10.3 mg/dL 2.8(U8.8(L)  Total Protein 6.5 - 8.1 g/dL 6.1(L)  Total Bilirubin 0.3 - 1.2 mg/dL 1.3(K0.2(L)  Alkaline Phos 38 - 126 U/L 153(H)  AST 15 - 41 U/L 13(L)  ALT 0 - 44 U/L 12    Discharge instruction: per After Visit Summary and "Baby and Me Booklet".  After visit meds: Tylenol and IBU, depo provera prior to discharge   Diet: No evidence of DVT seen on physical exam.  Activity: Advance as tolerated. Pelvic rest for 6 weeks.   Outpatient follow up:4 weeks Follow up Appt: Plans F/U w/ [facility] in [place], plans IUD there Follow up Visit:   Please schedule this patient for Postpartum visit in: 4 weeks with the following provider: Any provider For C/S patients schedule nurse incision check in weeks 2 weeks: Na High risk pregnancy complicated by: HTN Delivery mode:  SVD Anticipated Birth Control:  Nexplanon, IUD, depo provera prior to discharge PP Procedures needed: BP check  Schedule Integrated BH visit: yes      Newborn Data: Live born female  Birth Weight:   APGAR: ,   Newborn Delivery   Birth date/time:  09/21/2018 13:35:00 Delivery type:       Baby Feeding: Bottle Disposition:Placing for adoption

## 2018-09-21 NOTE — Progress Notes (Signed)
Patient ID: Victoria Briggs, female   DOB: [birth year], 38 y.o.   MRN: [id number] Late entry: Severe range BP x 5 noted during transition and second stage. RN communicated that to CNM. Pre-E orderset initiated, but Anesthesiologist requested that Antihypertensives not be given due to recent placement of epidural and potential for dropping BP too low suddenly. Repeat BP no longer severe-range. Will CTO closely.   Katrinka Blazing, [place], [place] 09/21/2018 4:04 PM

## 2018-09-21 NOTE — Anesthesia Procedure Notes (Signed)
Epidural Patient location during procedure: OB Start time: 09/21/2018 1:45 PM End time: 09/21/2018 2:00 PM  Staffing Anesthesiologist: Elmer Picker, MD Performed: anesthesiologist   Preanesthetic Checklist Completed: patient identified, pre-op evaluation, timeout performed, IV checked, risks and benefits discussed and monitors and equipment checked  Epidural Patient position: sitting Prep: site prepped and draped and DuraPrep Patient monitoring: continuous pulse ox, blood pressure, heart rate and cardiac monitor Approach: midline Location: L3-L4 Injection technique: LOR air  Needle:  Needle type: Tuohy  Needle gauge: 17 G Needle length: 9 cm Needle insertion depth: 7 cm Catheter type: closed end flexible Catheter size: 19 Gauge Catheter at skin depth: 13 cm Test dose: negative  Assessment Sensory level: T8 Events: blood not aspirated, injection not painful, no injection resistance, negative IV test and no paresthesia  Additional Notes Patient identified. Risks/Benefits/Options discussed with patient including but not limited to bleeding, infection, nerve damage, paralysis, failed block, incomplete pain control, headache, blood pressure changes, nausea, vomiting, reactions to medication both or allergic, itching and postpartum back pain. Confirmed with bedside nurse the patient's most recent platelet count. Confirmed with patient that they are not currently taking any anticoagulation, have any bleeding history or any family history of bleeding disorders. Patient expressed understanding and wished to proceed. All questions were answered. Sterile technique was used throughout the entire procedure. Please see nursing notes for vital signs. Test dose was given through epidural catheter and negative prior to continuing to dose epidural or start infusion. Warning signs of high block given to the patient including shortness of breath, tingling/numbness in hands, complete motor block, or  any concerning symptoms with instructions to call for help. Patient was given instructions on fall risk and not to get out of bed. All questions and concerns addressed with instructions to call with any issues or inadequate analgesia.  Reason for block:procedure for pain

## 2018-09-21 NOTE — H&P (Signed)
HPI: Victoria Briggs is a 38 y.o. year old [phone] female at [redacted]w[redacted]d weeks gestation by certain LMP and 18 week [place] who presents to MAU reporting Labor. Elevated BP in MAU. Pt reports elevated BP at last prenatal visit 09/19/18.   Gets prenatal care at [facility] in [place]. Records reviewed (under Media tab).  Periods of homelessness during pregnancy. Plants put baby up for adoption. Does not have custody of other children.   Associated symptoms: Denies Headache, vision changes, epigastric pain Contractions: Q2-3 minutes Vaginal bleeding: denies Fetal movement: nml  OB History    Gravida  6   Para  3   Term  3   Preterm      AB  2   Living  3     SAB      TAB  2   Ectopic      Multiple      Live Births  3          Past Medical History:  Diagnosis Date  . Medical history non-contributory    Past Surgical History:  Procedure Laterality Date  . CHOLECYSTECTOMY  2008   Family History: family history is not on file. Social History:  reports that she has been smoking cigarettes. She has been smoking about 0.50 packs per day. She has never used smokeless tobacco. She reports previous alcohol use. She reports that she does not use drugs.     Maternal Diabetes: No Genetic Screening: Declined Maternal Ultrasounds/Referrals: Normal Fetal Ultrasounds or other Referrals:  None Maternal Substance Abuse:  Yes:  Type: Marijuana Significant Maternal Medications:  None Significant Maternal Lab Results:  Lab values include: Other: GBS Pending Other Comments:  PLanning adoption.   Review of Systems  Constitutional: Negative for chills and fever.  Eyes: Negative for blurred vision.  Gastrointestinal: Positive for abdominal pain (Contractions only) and vomiting.  Neurological: Negative for headaches.   Maternal Medical History:  Reason for admission: Contractions.   Contractions: Onset was 1-2 hours ago.   Frequency: regular.   Perceived severity is strong.     Fetal activity: Perceived fetal activity is normal.   Last perceived fetal movement was within the past hour.    Prenatal complications: PIH.   Prenatal Complications - Diabetes: none.    Dilation: 6 Effacement (%): 90 Station: -2 Exam by:: Victoria Briggs rNC Blood pressure (!) 156/99, pulse 95, temperature (!) 97.5 F (36.4 C), temperature source Oral, resp. rate 18, last menstrual period 12/25/2017, SpO2 96 %. Maternal Exam:  Uterine Assessment: Contraction strength is moderate.  Abdomen: Patient reports no abdominal tenderness. Estimated fetal weight is 6 lb.   Fetal presentation: vertex  Introitus: Normal vulva. Normal vagina.  Vagina is negative for discharge.  Pelvis: adequate for delivery.   Cervix: Cervix evaluated by digital exam.     Fetal Exam Fetal Monitor Review: Mode: ultrasound.   Baseline rate: 115.  Variability: moderate (6-25 bpm).   Pattern: accelerations present and no decelerations.    Fetal State Assessment: Category I - tracings are normal.     Physical Exam  Constitutional: She is oriented to person, place, and time. She appears well-developed and well-nourished. She appears distressed.  Eyes: No scleral icterus.  Cardiovascular: Normal rate and regular rhythm.  Respiratory: Effort normal. No respiratory distress.  GI: Soft. There is no abdominal tenderness.  Genitourinary:    Vulva and vagina normal.     No vaginal discharge.     Genitourinary Comments: Pos bloody show   Musculoskeletal:  General: Edema present.  Neurological: She is alert and oriented to person, place, and time. She has normal reflexes.  Skin: Skin is warm and dry.  Psychiatric: She has a normal mood and affect.    Prenatal labs: ABO, Rh: --/--/A POS, A POS Performed at [facility], [address] [place], [place] [address]  [phone](05/03 1231) Antibody: NEG (05/03 1231) Rubella: Nonimmune (11/21 0000) RPR: Nonreactive (11/21 0000)  HBsAg: Negative  (11/21 0000)  HIV: nonreactive (02/27 0000)  GBS:   Pending GTT 135  Assessment: 1. Labor: Active 2. Fetal Wellbeing: Category I  3. Pain Control: Planning epidural 4. GBS: Pending 5. 38.4 week IUP 6. GHTN vs PreE 7. Planning adoption  Plan:  1. Admit to BS per consult with MD 2. Routine L&D orders 3. Analgesia/anesthesia PRN  4. Pre-E labs  5. SW consult  Victoria KinsmanVirginia Briggs Ang 09/21/2018, 1:23 PM

## 2018-09-21 NOTE — Anesthesia Postprocedure Evaluation (Signed)
Anesthesia Post Note  Patient: Monnica Thrall  Procedure(s) Performed: AN AD HOC LABOR EPIDURAL     Patient location during evaluation: OB High Risk Anesthesia Type: Epidural Level of consciousness: awake and alert Pain management: pain level controlled Vital Signs Assessment: post-procedure vital signs reviewed and stable Respiratory status: spontaneous breathing, nonlabored ventilation and respiratory function stable Cardiovascular status: stable Postop Assessment: no headache, no backache and epidural receding Anesthetic complications: no    Last Vitals:  Vitals:   09/21/18 1708 09/21/18 1803  BP: (!) 154/88 (!) 147/88  Pulse: 64 72  Resp:  18  Temp: 36.8 C 36.8 C  SpO2: 98% 98%    Last Pain:  Vitals:   09/21/18 1815  TempSrc:   PainSc: 0-No pain   Pain Goal: Patients Stated Pain Goal: 0 (09/21/18 1228)              Epidural/Spinal Function Cutaneous sensation: Normal sensation (09/21/18 1815), Patient able to flex knees: Yes (09/21/18 1815), Patient able to lift hips off bed: Yes (09/21/18 1815), Back pain beyond tenderness at insertion site: No (09/21/18 1815), Progressively worsening motor and/or sensory loss: No (09/21/18 1815), Bowel and/or bladder incontinence post epidural: No (09/21/18 1815)  Marquette Saa

## 2018-09-21 NOTE — MAU Note (Signed)
Pt reports ctx q 2 min for past few hours.  PT denies LOF or vag bleeding.  Reports good fetal movement.

## 2018-09-21 NOTE — MAU Provider Note (Signed)
Chief Complaint  Patient presents with  . Contractions     First Provider Initiated Contact with Patient 09/21/18 1209      S: Victoria Briggs  is a 38 y.o. y.o. year old [phone] female at [redacted]w[redacted]d weeks gestation who presents to ED and transferred to MAU reporting contractions every 2-3 minutes and found to have elevated blood pressures. Pos Hx HTN at last prenatal appt per pt. Hx Pre-E w/ first. Current blood pressure medication: None.  Gets prenatal care at [facility] in [place].   Periods of homelessness during pregnancy. Plants put baby up for adoption. Does not have custody of other children.   Associated symptoms: Denies Headache, vision changes, epigastric pain Contractions: Q2-3 minutes Vaginal bleeding: denies Fetal movement: nml  O:  Patient Vitals for the past 24 hrs:  BP Temp Temp src Pulse Resp SpO2  09/21/18 1155 (!) 156/99 - - 95 - 96 %  09/21/18 1117 137/90 (!) 97.5 F (36.4 C) Oral 90 18 98 %   General: NAD Heart: Regular rate Lungs: Normal rate and effort Abd: Soft, NT, Gravid, S=D Extremities: 1+Pedal edema Neuro: 2+ deep tendon reflexes, No clonus Pelvic: NEFG, no bleeding or LOF.   Dilation: 3.5 Effacement (%): 80 Cervical Position: Middle Presentation: Vertex Exam by:: Marvel Plan RN   EFM: 115, Moderate variability, 15 x 15 accelerations, no decelerations Toco: Not tracing. Pt reports Q2-3 min  Results for orders placed or performed during the hospital encounter of 09/21/18 (from the past 24 hour(s))  CBC     Status: Abnormal   Collection Time: 09/21/18 12:03 PM  Result Value Ref Range   WBC 13.2 (H) 4.0 - 10.5 K/uL   RBC 4.44 3.87 - 5.11 MIL/uL   Hemoglobin 12.9 12.0 - 15.0 g/dL   HCT 71.0 62.6 - 94.8 %   MCV 84.5 80.0 - 100.0 fL   MCH 29.1 26.0 - 34.0 pg   MCHC 34.4 30.0 - 36.0 g/dL   RDW 54.6 27.0 - 35.0 %   Platelets 270 150 - 400 K/uL   nRBC 0.0 0.0 - 0.2 %  Comprehensive metabolic panel     Status: Abnormal   Collection Time:  09/21/18 12:03 PM  Result Value Ref Range   Sodium 135 135 - 145 mmol/L   Potassium 4.1 3.5 - 5.1 mmol/L   Chloride 104 98 - 111 mmol/L   CO2 19 (L) 22 - 32 mmol/L   Glucose, Bld 88 70 - 99 mg/dL   BUN 9 6 - 20 mg/dL   Creatinine, Ser 0.93 0.44 - 1.00 mg/dL   Calcium 8.8 (L) 8.9 - 10.3 mg/dL   Total Protein 6.1 (L) 6.5 - 8.1 g/dL   Albumin 2.2 (L) 3.5 - 5.0 g/dL   AST 13 (L) 15 - 41 U/L   ALT 12 0 - 44 U/L   Alkaline Phosphatase 153 (H) 38 - 126 U/L   Total Bilirubin 0.2 (L) 0.3 - 1.2 mg/dL   GFR calc non Af Amer >60 >60 mL/min   GFR calc Af Amer >60 >60 mL/min   Anion gap 12 5 - 15  Type and screen     Status: None   Collection Time: 09/21/18 12:31 PM  Result Value Ref Range   ABO/RH(D) A POS    Antibody Screen NEG    Sample Expiration      09/24/2018 Performed at [facility], [address] [place], [place] [address]   ABO/Rh     Status: None  Collection Time: 09/21/18 12:31 PM  Result Value Ref Range   ABO/RH(D)      A POS Performed at [facility], [address] [place], [place] [address]   Group B strep by PCR     Status: None   Collection Time: 09/21/18  1:33 PM  Result Value Ref Range   Group B strep by PCR NEGATIVE NEGATIVE  Urinalysis, Routine w reflex microscopic     Status: Abnormal   Collection Time: 09/21/18  3:05 PM  Result Value Ref Range   Color, Urine YELLOW YELLOW   APPearance CLEAR CLEAR   Specific Gravity, Urine 1.020 1.005 - 1.030   pH 5.0 5.0 - 8.0   Glucose, UA NEGATIVE NEGATIVE mg/dL   Hgb urine dipstick NEGATIVE NEGATIVE   Bilirubin Urine NEGATIVE NEGATIVE   Ketones, ur 20 (A) NEGATIVE mg/dL   Protein, ur 30 (A) NEGATIVE mg/dL   Nitrite NEGATIVE NEGATIVE   Leukocytes,Ua NEGATIVE NEGATIVE   RBC / HPF 0-5 0 - 5 RBC/hpf   WBC, UA 0-5 0 - 5 WBC/hpf   Bacteria, UA RARE (A) NONE SEEN   Squamous Epithelial / LPF 0-5 0 - 5   Mucus PRESENT    Hyaline Casts, UA PRESENT   Protein / creatinine ratio, urine     Status: Abnormal    Collection Time: 09/21/18  3:05 PM  Result Value Ref Range   Creatinine, Urine 155.86 mg/dL   Total Protein, Urine 30 mg/dL   Protein Creatinine Ratio 0.19 (H) 0.00 - 0.15 mg/mg[Cre]  Rapid urine drug screen (hospital performed)     Status: Abnormal   Collection Time: 09/21/18  3:05 PM  Result Value Ref Range   Opiates NONE DETECTED NONE DETECTED   Cocaine NONE DETECTED NONE DETECTED   Benzodiazepines NONE DETECTED NONE DETECTED   Amphetamines NONE DETECTED NONE DETECTED   Tetrahydrocannabinol POSITIVE (A) NONE DETECTED   Barbiturates NONE DETECTED NONE DETECTED   Cervix changed to 6 cm.   A: 7360w4d week IUP Gestational hypertension vs Preeclampsia    FHR reactive  P: Admit to L&D  Katrinka BlazingSmith, [place], CNM 09/21/2018 12:32 PM

## 2018-09-21 NOTE — Plan of Care (Signed)
Progressing

## 2018-09-22 ENCOUNTER — Encounter: Payer: Self-pay | Admitting: *Deleted

## 2018-09-22 LAB — RPR: RPR Ser Ql: NONREACTIVE

## 2018-09-22 NOTE — Clinical Social Work Maternal (Signed)
**Note Victoria-Identified via Obfuscation** CLINICAL SOCIAL WORK MATERNAL/CHILD NOTE  Patient Details  Name: Victoria Briggs MRN: [id number] Date of Birth: 1980/06/17  Date:  09/22/2018  Clinical Social Worker Initiating Note:  Laurey Arrow Date/Time: Initiated:  09/22/18/1343     Child's Name:  Victoria Briggs   Biological Parents:  Mother(FOB has a Psychologist, counselling. )   Need for Interpreter:  None   Reason for Referral:  Behavioral Health Concerns, Late or No Prenatal Care , Adoption, Current Substance Use/Substance Use During Pregnancy    Address:  [address] [place] [address]    Phone number:  [phone] (home)     Additional phone number: Adopting mother [place].  Household Members/Support Persons (HM/SP):   (Per MOB, MOB live with her adult daughter daughter.  MOB has a total of 3 children. The 2 children under age 31 are not in MOB's custody. )   HM/SP Name Relationship DOB or Age  HM/SP -1        HM/SP -2        HM/SP -3        HM/SP -4        HM/SP -5        HM/SP -6        HM/SP -7        HM/SP -8          Natural Supports (not living in the home):  Immediate Family, Friends   Professional Supports: Therapist(MOB reported receiving outpatient therapy with [facility] in [place], [place]. 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    Education:  9 to 11 years   Homebound arranged: No  Financial Resources:  Medicaid   Other Resources:      Cultural/Religious Considerations Which May Impact Care:    Strengths:  Psychotropic Medications, Understanding of illness   Psychotropic Medications:  Celexa      Pediatrician:       Pediatrician List:   [place]      Pediatrician Fax Number:    Risk Factors/Current Problems:  Mental Health Concerns , Substance Use    Cognitive State:  Able to Concentrate ,  Alert , Linear Thinking , Insightful , Goal Oriented    Mood/Affect:  Interested , Relaxed , Comfortable , Calm    CSW Assessment: CSW met with MOB in room 106 to complete an assessment for an adoption, SA hx, unstable housing and limited PNC.  When CSW arrived, infant was asleep in bassinet, adopting mother was on the couch and MOB was resting in bed.  CSW explained CSW's role and MOB gave CSW permission to have adopting mother to leave the room in order to assess MOB in private. Prior to adopting mother leaving the room she provide me with her name, her attorney's name, and her demographic information Victoria Briggs and Victoria Briggs; Victoria Briggs. Victoria Briggs, Victoria Briggs [id number]). Adopting mother also reported that she will be legally represented by [employer] [phone] Nira Conn).  MOB was polite, was easy to engage, and receptive to meeting with CSW.    CSW asked about MOB's supports and MOB identified her oldest daughter as her primary support person Victoria Briggs 02/06/1999).  MOB denied having unstable housing and reported residing with her oldest daughter. CSW asked about MOB's desire to doing  an adoption plan and MOB communicated, "That is my plan."  MOB informed CSW that MOB identified the [place] in September (2019) as the adopting the parents.  Per MOB, the couple are friends with MOB and MOB stated, "I feel comfortable and confident they will take care of my baby."  MOB shared that doing an adoption plan was best for MOB because "I don't want to start over with parenting again.  I have 2 grown daughters and a 26 year old son and having a new baby a few months younger than my grandchild will be too much."  CSW normalized and validated MOB's thoughts and feelings. MOB reported having the support of her 2 adult daughter and feeling confident in her decision.   CSW asked about the custody of MOB's 2 minor children and MOB reported, "My 2nd daughter is 11 and has her own place with her dad, and my  son is with his father and I have not seen him in over 3 years."  MOB reported feelings of sadness about no being able to see her son and informed CSW that she is currently receiving counseling through [facility] and has an active Rx for Celexa. MOB shared that MOB feels like outpatient counseling an medication are increasing her mood.     CSW provided education regarding the baby blues period vs. perinatal mood disorders, discussed treatment and gave resources for mental health follow up if concerns arise.  CSW recommends self-evaluation during the postpartum time period using the New Mom Checklist from Postpartum Progress and encouraged MOB to contact a medical professional if symptoms are noted at any time. MOB presented with insight and awareness a did not demonstrate any acute MH symptoms. CSW assessed for safety and MOB denied SI, HI, and DV.  CSW assessed for follow-up care after MOB discharges and MOB denied having barriers.   CSW informed MOB of hospital's Limited PNC and SA policy and MOB was understanding.  MOB denied the use of all illicit substance.  CSW made MOB aware of MOB's positive UDS for THC on 5/3 and MOB stated, "Well I be around weed everyday but I don't smoke any."  CSW also made MOB aware that CSW will monitor infant's UDS and CDS and will make a report to [place] if warranted. MOB denied CPS hx.  CSW spoke with [place] worker Wendall Stade) and [place] CPS worker [facility]) and the both reported no concerns or barriers at this time.   CSW with spoke with Adoptive Mom attorney and requested a "Temporary Transfer of Custody Form for the Purpose of and Adoption; the attorney agreed to follow-up with CSW with the requested information.  CSW Plan/Description:  No Further Intervention Required/No Barriers to Discharge, Perinatal Mood and Anxiety Disorder (PMADs) Education, Other Patient/Family Education, Other Information/Referral to [employer], CSW Will Continue to Monitor Umbilical Cord Tissue Drug Screen Results and Make Report if Warranted   Laurey Arrow, MSW, LCSW Clinical Social Work [phone]  Dimple Nanas, LCSW 09/22/2018, 1:54 PM

## 2018-09-22 NOTE — Progress Notes (Signed)
Post Partum Day 1 Subjective: no complaints, up ad lib, voiding and tolerating PO  Objective: Blood pressure (!) 145/76, pulse 71, temperature 98.1 F (36.7 C), temperature source Oral, resp. rate 18, height 5\' 2"  (1.575 m), weight 97.5 kg, last menstrual period 12/25/2017, SpO2 98 %, unknown if currently breastfeeding.   Physical Exam:  General: alert, cooperative and no distress Lochia: appropriate Uterine Fundus: firm Incision: n/a  DVT Evaluation: No evidence of DVT seen on physical exam.  Recent Labs    09/21/18 1203  HGB 12.9  HCT 37.5    Assessment/Plan: Social Work consult, [facility] Follow BP  LOS: 1 day   Scheryl Darter 09/22/2018, 9:07 AM

## 2018-09-23 MED ORDER — MEDROXYPROGESTERONE ACETATE 150 MG/ML IM SUSP
150.0000 mg | Freq: Once | INTRAMUSCULAR | Status: AC
  Administered 2018-09-23: 09:00:00 150 mg via INTRAMUSCULAR
  Filled 2018-09-23: qty 1

## 2018-09-23 MED ORDER — IBUPROFEN 600 MG PO TABS: 600.0000 mg | ORAL_TABLET | Freq: Four times a day (QID) | ORAL | 0 refills | Status: AC

## 2018-09-23 MED ORDER — ACETAMINOPHEN 325 MG PO TABS: 650.0000 mg | ORAL_TABLET | ORAL | 1 refills | Status: AC | PRN

## 2018-09-23 NOTE — Progress Notes (Signed)
Discharge teaching complete with pt. Pt understood all information and did not have any questions. Pt discharged home with family.  

## 2018-09-23 NOTE — Progress Notes (Signed)
CSW met with MOB in room 106.  When CSW arrived, MOB was resting in bed, infant was asleep in bassinet, and adopting mother was resting in the recliner. With MOB's permission CSW asked adopting mother to leave the room in order to meet with MOB in private; adoptive mother left without incident.  MOB was polite and receptive to meeting with CSW.    CSW inquired about MOB's thoughts an feeling regarding preceding with her adoption plan.  MOB reported feeling good and stated, " I know I am making the right decision."  CSW asked if MOB wanted to move forward with signing the Transfer of custody documents and MOB communicated, "Yes."  CSW read documents to MOB and encouraged MOB to ask questions.  MOB denied having any questions and concerns. Documents were signed and was given to bedside nurse.  A copy was also emailed to [employer].   MOB reported being ready for discharged and reported that her oldest daughter will be picking her up from the hospital.    CSW updated bedside nurse and AC.  CSW will continue to offer adopting parents resources and supports while infant remains in NICU.  Angel Boyd-Gilyard, MSW, LCSW Clinical Social Work [phone]  

## 2018-09-23 NOTE — Discharge Instructions (Signed)
Vaginal Delivery, Care After °Refer to this sheet in the next few weeks. These instructions provide you with information about caring for yourself after vaginal delivery. Your health care provider may also give you more specific instructions. Your treatment has been planned according to current medical practices, but problems sometimes occur. Call your health care provider if you have any problems or questions. °What can I expect after the procedure? °After vaginal delivery, it is common to have: °· Some bleeding from your vagina. °· Soreness in your abdomen, your vagina, and the area of skin between your vaginal opening and your anus (perineum). °· Pelvic cramps. °· Fatigue. °Follow these instructions at home: °Medicines °· Take over-the-counter and prescription medicines only as told by your health care provider. °· If you were prescribed an antibiotic medicine, take it as told by your health care provider. Do not stop taking the antibiotic until it is finished. °Driving ° °· Do not drive or operate heavy machinery while taking prescription pain medicine. °· Do not drive for 24 hours if you received a sedative. °Lifestyle °· Do not drink alcohol. This is especially important if you are breastfeeding or taking medicine to relieve pain. °· Do not use tobacco products, including cigarettes, chewing tobacco, or e-cigarettes. If you need help quitting, ask your health care provider. °Eating and drinking °· Drink at least 8 eight-ounce glasses of water every day unless you are told not to by your health care provider. If you choose to breastfeed your baby, you may need to drink more water than this. °· Eat high-fiber foods every day. These foods may help prevent or relieve constipation. High-fiber foods include: °? Whole grain cereals and breads. °? Brown rice. °? Beans. °? Fresh fruits and vegetables. °Activity °· Return to your normal activities as told by your health care provider. Ask your health care provider what  activities are safe for you. °· Rest as much as possible. Try to rest or take a nap when your baby is sleeping. °· Do not lift anything that is heavier than your baby or 10 lb (4.5 kg) until your health care provider says that it is safe. °· Talk with your health care provider about when you can engage in sexual activity. This may depend on your: °? Risk of infection. °? Rate of healing. °? Comfort and desire to engage in sexual activity. °Vaginal Care °· If you have an episiotomy or a vaginal tear, check the area every day for signs of infection. Check for: °? More redness, swelling, or pain. °? More fluid or blood. °? Warmth. °? Pus or a bad smell. °· Do not use tampons or douches until your health care provider says this is safe. °· Watch for any blood clots that may pass from your vagina. These may look like clumps of dark red, brown, or black discharge. °General instructions °· Keep your perineum clean and dry as told by your health care provider. °· Wear loose, comfortable clothing. °· Wipe from front to back when you use the toilet. °· Ask your health care provider if you can shower or take a bath. If you had an episiotomy or a perineal tear during labor and delivery, your health care provider may tell you not to take baths for a certain length of time. °· Wear a bra that supports your breasts and fits you well. °· If possible, have someone help you with household activities and help care for your baby for at least a few days after you   leave the hospital. °· Keep all follow-up visits for you and your baby as told by your health care provider. This is important. °Contact a health care provider if: °· You have: °? Vaginal discharge that has a bad smell. °? Difficulty urinating. °? Pain when urinating. °? A sudden increase or decrease in the frequency of your bowel movements. °? More redness, swelling, or pain around your episiotomy or vaginal tear. °? More fluid or blood coming from your episiotomy or vaginal  tear. °? Pus or a bad smell coming from your episiotomy or vaginal tear. °? A fever. °? A rash. °? Little or no interest in activities you used to enjoy. °? Questions about caring for yourself or your baby. °· Your episiotomy or vaginal tear feels warm to the touch. °· Your episiotomy or vaginal tear is separating or does not appear to be healing. °· Your breasts are painful, hard, or turn red. °· You feel unusually sad or worried. °· You feel nauseous or you vomit. °· You pass large blood clots from your vagina. If you pass a blood clot from your vagina, save it to show to your health care provider. Do not flush blood clots down the toilet without having your health care provider look at them. °· You urinate more than usual. °· You are dizzy or light-headed. °· You have not breastfed at all and you have not had a menstrual period for 12 weeks after delivery. °· You have stopped breastfeeding and you have not had a menstrual period for 12 weeks after you stopped breastfeeding. °Get help right away if: °· You have: °? Pain that does not go away or does not get better with medicine. °? Chest pain. °? Difficulty breathing. °? Blurred vision or spots in your vision. °? Thoughts about hurting yourself or your baby. °· You develop pain in your abdomen or in one of your legs. °· You develop a severe headache. °· You faint. °· You bleed from your vagina so much that you fill two sanitary pads in one hour. °This information is not intended to replace advice given to you by your health care provider. Make sure you discuss any questions you have with your health care provider. °Document Released: 05/04/2000 Document Revised: 10/19/2015 Document Reviewed: 05/22/2015 °[employer] Patient Education © 2019 [employer] ° °

## 2023-05-08 ENCOUNTER — Other Ambulatory Visit (HOSPITAL_BASED_OUTPATIENT_CLINIC_OR_DEPARTMENT_OTHER): Payer: Self-pay

## 2023-05-08 MED ORDER — WEGOVY 1 MG/0.5ML ~~LOC~~ SOAJ
1.0000 mg | SUBCUTANEOUS | 0 refills | Status: AC
  Filled 2023-05-08: qty 2, 28d supply, fill #0

## 2023-06-12 ENCOUNTER — Other Ambulatory Visit (HOSPITAL_BASED_OUTPATIENT_CLINIC_OR_DEPARTMENT_OTHER): Payer: Self-pay

## 2023-06-12 MED ORDER — WEGOVY 2.4 MG/0.75ML ~~LOC~~ SOAJ
0.7500 mL | SUBCUTANEOUS | 2 refills | Status: AC
  Filled 2023-07-05: qty 3, 28d supply, fill #0
  Filled 2023-08-02: qty 3, 28d supply, fill #1
  Filled 2024-01-31: qty 3, 28d supply, fill #2

## 2023-06-12 MED ORDER — WEGOVY 1.7 MG/0.75ML ~~LOC~~ SOAJ
0.7500 mL | SUBCUTANEOUS | 0 refills | Status: AC
  Filled 2023-06-12: qty 3, 28d supply, fill #0

## 2023-07-05 ENCOUNTER — Other Ambulatory Visit (HOSPITAL_BASED_OUTPATIENT_CLINIC_OR_DEPARTMENT_OTHER): Payer: Self-pay

## 2023-08-02 ENCOUNTER — Other Ambulatory Visit (HOSPITAL_BASED_OUTPATIENT_CLINIC_OR_DEPARTMENT_OTHER): Payer: Self-pay

## 2023-08-28 ENCOUNTER — Other Ambulatory Visit (HOSPITAL_BASED_OUTPATIENT_CLINIC_OR_DEPARTMENT_OTHER): Payer: Self-pay

## 2023-08-28 MED ORDER — ONDANSETRON 8 MG PO TBDP
8.0000 mg | ORAL_TABLET | Freq: Three times a day (TID) | ORAL | 0 refills | Status: DC | PRN
  Filled 2023-08-28: qty 60, 20d supply, fill #0

## 2023-08-28 MED ORDER — WEGOVY 2.4 MG/0.75ML ~~LOC~~ SOAJ
2.4000 mg | SUBCUTANEOUS | 2 refills | Status: AC
  Filled 2023-08-28: qty 3, 28d supply, fill #0
  Filled 2023-09-26 – 2023-10-08 (×2): qty 3, 28d supply, fill #1
  Filled 2023-11-01: qty 3, 28d supply, fill #2

## 2023-08-30 ENCOUNTER — Other Ambulatory Visit (HOSPITAL_BASED_OUTPATIENT_CLINIC_OR_DEPARTMENT_OTHER): Payer: Self-pay

## 2023-09-06 ENCOUNTER — Other Ambulatory Visit (HOSPITAL_BASED_OUTPATIENT_CLINIC_OR_DEPARTMENT_OTHER): Payer: Self-pay

## 2023-09-26 ENCOUNTER — Other Ambulatory Visit (HOSPITAL_BASED_OUTPATIENT_CLINIC_OR_DEPARTMENT_OTHER): Payer: Self-pay

## 2023-10-07 ENCOUNTER — Other Ambulatory Visit (HOSPITAL_BASED_OUTPATIENT_CLINIC_OR_DEPARTMENT_OTHER): Payer: Self-pay

## 2023-10-08 ENCOUNTER — Other Ambulatory Visit (HOSPITAL_BASED_OUTPATIENT_CLINIC_OR_DEPARTMENT_OTHER): Payer: Self-pay

## 2023-10-11 ENCOUNTER — Other Ambulatory Visit (HOSPITAL_BASED_OUTPATIENT_CLINIC_OR_DEPARTMENT_OTHER): Payer: Self-pay

## 2023-10-24 ENCOUNTER — Other Ambulatory Visit (HOSPITAL_BASED_OUTPATIENT_CLINIC_OR_DEPARTMENT_OTHER): Payer: Self-pay

## 2023-10-24 MED ORDER — ONDANSETRON 8 MG PO TBDP
8.0000 mg | ORAL_TABLET | Freq: Three times a day (TID) | ORAL | 0 refills | Status: AC | PRN
  Filled 2023-10-24: qty 60, 20d supply, fill #0

## 2023-11-01 ENCOUNTER — Other Ambulatory Visit (HOSPITAL_BASED_OUTPATIENT_CLINIC_OR_DEPARTMENT_OTHER): Payer: Self-pay

## 2023-11-02 ENCOUNTER — Other Ambulatory Visit (HOSPITAL_BASED_OUTPATIENT_CLINIC_OR_DEPARTMENT_OTHER): Payer: Self-pay

## 2023-11-27 ENCOUNTER — Other Ambulatory Visit (HOSPITAL_BASED_OUTPATIENT_CLINIC_OR_DEPARTMENT_OTHER): Payer: Self-pay

## 2023-11-27 MED ORDER — WEGOVY 2.4 MG/0.75ML ~~LOC~~ SOAJ
2.4000 mg | SUBCUTANEOUS | 2 refills | Status: AC
  Filled 2023-11-27 – 2023-12-07 (×2): qty 3, 28d supply, fill #0
  Filled 2024-01-03: qty 3, 28d supply, fill #1

## 2023-12-05 ENCOUNTER — Other Ambulatory Visit (HOSPITAL_BASED_OUTPATIENT_CLINIC_OR_DEPARTMENT_OTHER): Payer: Self-pay

## 2023-12-07 ENCOUNTER — Other Ambulatory Visit (HOSPITAL_BASED_OUTPATIENT_CLINIC_OR_DEPARTMENT_OTHER): Payer: Self-pay

## 2024-01-03 ENCOUNTER — Other Ambulatory Visit (HOSPITAL_BASED_OUTPATIENT_CLINIC_OR_DEPARTMENT_OTHER): Payer: Self-pay

## 2024-01-04 ENCOUNTER — Other Ambulatory Visit (HOSPITAL_BASED_OUTPATIENT_CLINIC_OR_DEPARTMENT_OTHER): Payer: Self-pay

## 2024-01-31 ENCOUNTER — Other Ambulatory Visit (HOSPITAL_BASED_OUTPATIENT_CLINIC_OR_DEPARTMENT_OTHER): Payer: Self-pay

## 2024-02-01 ENCOUNTER — Other Ambulatory Visit (HOSPITAL_BASED_OUTPATIENT_CLINIC_OR_DEPARTMENT_OTHER): Payer: Self-pay

## 2024-02-12 ENCOUNTER — Other Ambulatory Visit (HOSPITAL_BASED_OUTPATIENT_CLINIC_OR_DEPARTMENT_OTHER): Payer: Self-pay

## 2024-02-12 MED ORDER — WEGOVY 2.4 MG/0.75ML ~~LOC~~ SOAJ
2.4000 mg | SUBCUTANEOUS | 0 refills | Status: AC
  Filled 2024-02-12: qty 3, 28d supply, fill #0
